# Patient Record
Sex: Male | Born: 1945 | Race: White | Hispanic: No | Smoking: Former smoker
Health system: Southern US, Community
[De-identification: ages and names within clinical notes are randomized; demographics above are authoritative.]

## PROBLEM LIST (undated history)

## (undated) DIAGNOSIS — C801 Malignant (primary) neoplasm, unspecified: Secondary | ICD-10-CM

## (undated) DIAGNOSIS — J449 Chronic obstructive pulmonary disease, unspecified: Secondary | ICD-10-CM

## (undated) HISTORY — PX: HERNIA REPAIR: SHX51

## (undated) HISTORY — PX: CHOLECYSTECTOMY: SHX55

---

## 2008-08-02 ENCOUNTER — Ambulatory Visit: Payer: Self-pay | Admitting: General Practice

## 2010-02-18 ENCOUNTER — Emergency Department: Payer: Self-pay | Admitting: Emergency Medicine

## 2017-11-06 ENCOUNTER — Emergency Department: Payer: Medicare Other

## 2017-11-06 ENCOUNTER — Inpatient Hospital Stay
Admission: EM | Admit: 2017-11-06 | Discharge: 2017-11-08 | DRG: 191 | Disposition: A | Discharge status: Home Health | Payer: Medicare Other | Attending: Internal Medicine | Admitting: Internal Medicine

## 2017-11-06 ENCOUNTER — Other Ambulatory Visit: Payer: Self-pay

## 2017-11-06 DIAGNOSIS — Z79899 Other long term (current) drug therapy: Secondary | ICD-10-CM | POA: Diagnosis not present

## 2017-11-06 DIAGNOSIS — C679 Malignant neoplasm of bladder, unspecified: Secondary | ICD-10-CM | POA: Diagnosis present

## 2017-11-06 DIAGNOSIS — R0603 Acute respiratory distress: Secondary | ICD-10-CM | POA: Diagnosis present

## 2017-11-06 DIAGNOSIS — J441 Chronic obstructive pulmonary disease with (acute) exacerbation: Secondary | ICD-10-CM | POA: Diagnosis present

## 2017-11-06 DIAGNOSIS — D649 Anemia, unspecified: Secondary | ICD-10-CM | POA: Diagnosis not present

## 2017-11-06 DIAGNOSIS — E876 Hypokalemia: Secondary | ICD-10-CM | POA: Diagnosis present

## 2017-11-06 DIAGNOSIS — Z87891 Personal history of nicotine dependence: Secondary | ICD-10-CM | POA: Diagnosis not present

## 2017-11-06 DIAGNOSIS — C775 Secondary and unspecified malignant neoplasm of intrapelvic lymph nodes: Secondary | ICD-10-CM | POA: Diagnosis present

## 2017-11-06 DIAGNOSIS — Z6821 Body mass index (BMI) 21.0-21.9, adult: Secondary | ICD-10-CM | POA: Diagnosis not present

## 2017-11-06 DIAGNOSIS — K922 Gastrointestinal hemorrhage, unspecified: Secondary | ICD-10-CM | POA: Diagnosis present

## 2017-11-06 DIAGNOSIS — D6959 Other secondary thrombocytopenia: Secondary | ICD-10-CM | POA: Diagnosis present

## 2017-11-06 DIAGNOSIS — E440 Moderate protein-calorie malnutrition: Secondary | ICD-10-CM | POA: Diagnosis present

## 2017-11-06 DIAGNOSIS — Z9981 Dependence on supplemental oxygen: Secondary | ICD-10-CM | POA: Diagnosis not present

## 2017-11-06 DIAGNOSIS — R0602 Shortness of breath: Secondary | ICD-10-CM | POA: Diagnosis not present

## 2017-11-06 DIAGNOSIS — D61818 Other pancytopenia: Secondary | ICD-10-CM | POA: Diagnosis present

## 2017-11-06 DIAGNOSIS — T451X5A Adverse effect of antineoplastic and immunosuppressive drugs, initial encounter: Secondary | ICD-10-CM | POA: Diagnosis not present

## 2017-11-06 DIAGNOSIS — D6481 Anemia due to antineoplastic chemotherapy: Secondary | ICD-10-CM | POA: Diagnosis not present

## 2017-11-06 HISTORY — DX: Malignant (primary) neoplasm, unspecified: C80.1

## 2017-11-06 HISTORY — DX: Chronic obstructive pulmonary disease, unspecified: J44.9

## 2017-11-06 LAB — CBC WITH DIFFERENTIAL/PLATELET
Basophils Absolute: 0 10*3/uL (ref 0–0.1)
Basophils Relative: 0 %
Eosinophils Absolute: 0.1 10*3/uL (ref 0–0.7)
Eosinophils Relative: 1 %
HCT: 22.9 % — ABNORMAL LOW (ref 40.0–52.0)
Hemoglobin: 7.7 g/dL — ABNORMAL LOW (ref 13.0–18.0)
LYMPHS ABS: 2.6 10*3/uL (ref 1.0–3.6)
LYMPHS PCT: 29 %
MCH: 30 pg (ref 26.0–34.0)
MCHC: 33.6 g/dL (ref 32.0–36.0)
MCV: 89.3 fL (ref 80.0–100.0)
MONOS PCT: 8 %
Monocytes Absolute: 0.7 10*3/uL (ref 0.2–1.0)
NEUTROS ABS: 5.5 10*3/uL (ref 1.4–6.5)
NEUTROS PCT: 62 %
Platelets: 78 10*3/uL — ABNORMAL LOW (ref 150–440)
RBC: 2.56 MIL/uL — ABNORMAL LOW (ref 4.40–5.90)
RDW: 19.7 % — ABNORMAL HIGH (ref 11.5–14.5)
WBC: 8.9 10*3/uL (ref 3.8–10.6)

## 2017-11-06 LAB — BASIC METABOLIC PANEL WITH GFR
Anion gap: 10 (ref 5–15)
BUN: 27 mg/dL — ABNORMAL HIGH (ref 8–23)
CO2: 22 mmol/L (ref 22–32)
Calcium: 8.3 mg/dL — ABNORMAL LOW (ref 8.9–10.3)
Chloride: 106 mmol/L (ref 98–111)
Creatinine, Ser: 1.22 mg/dL (ref 0.61–1.24)
GFR calc Af Amer: >60 mL/min
GFR calc non Af Amer: 58 mL/min — ABNORMAL LOW
Glucose, Bld: 123 mg/dL — ABNORMAL HIGH (ref 70–99)
Potassium: 3.6 mmol/L (ref 3.5–5.1)
Sodium: 138 mmol/L (ref 135–145)

## 2017-11-06 LAB — FIBRIN DERIVATIVES D-DIMER (ARMC ONLY): Fibrin derivatives D-dimer (ARMC): 1298.57 ng/mL (FEU) — ABNORMAL HIGH (ref 0.00–499.00)

## 2017-11-06 LAB — TROPONIN I: Troponin I: 0.03 ng/mL (ref <0.03)

## 2017-11-06 MED ORDER — TRAZODONE HCL 50 MG PO TABS: 25.0000 mg | ORAL_TABLET | Freq: Every evening | ORAL | Status: DC | PRN

## 2017-11-06 MED ORDER — SODIUM CHLORIDE 0.9 % IV SOLN
8.0000 mg/h | INTRAVENOUS | Status: DC
  Administered 2017-11-06 – 2017-11-07 (×2): 8 mg/h via INTRAVENOUS
  Filled 2017-11-06 (×2): qty 80

## 2017-11-06 MED ORDER — DOCUSATE SODIUM 100 MG PO CAPS
100.0000 mg | ORAL_CAPSULE | Freq: Two times a day (BID) | ORAL | Status: DC
  Administered 2017-11-07 – 2017-11-08 (×3): 100 mg via ORAL
  Filled 2017-11-06 (×3): qty 1

## 2017-11-06 MED ORDER — IOHEXOL 350 MG/ML SOLN
75.0000 mL | Freq: Once | INTRAVENOUS | Status: AC | PRN
  Administered 2017-11-06: 75 mL via INTRAVENOUS

## 2017-11-06 MED ORDER — HYDROCODONE-ACETAMINOPHEN 5-325 MG PO TABS: 1.0000 | ORAL_TABLET | ORAL | Status: DC | PRN

## 2017-11-06 MED ORDER — BISACODYL 5 MG PO TBEC: 5.0000 mg | DELAYED_RELEASE_TABLET | Freq: Every day | ORAL | Status: DC | PRN

## 2017-11-06 MED ORDER — ACETAMINOPHEN 650 MG RE SUPP: 650.0000 mg | Freq: Four times a day (QID) | RECTAL | Status: DC | PRN

## 2017-11-06 MED ORDER — IPRATROPIUM-ALBUTEROL 0.5-2.5 (3) MG/3ML IN SOLN
3.0000 mL | Freq: Once | RESPIRATORY_TRACT | Status: AC
  Administered 2017-11-06: 3 mL via RESPIRATORY_TRACT
  Filled 2017-11-06: qty 3

## 2017-11-06 MED ORDER — ONDANSETRON HCL 4 MG PO TABS: 4.0000 mg | ORAL_TABLET | Freq: Four times a day (QID) | ORAL | Status: DC | PRN

## 2017-11-06 MED ORDER — ATORVASTATIN CALCIUM 20 MG PO TABS
80.0000 mg | ORAL_TABLET | Freq: Every day | ORAL | Status: DC
  Administered 2017-11-07: 80 mg via ORAL
  Filled 2017-11-06: qty 4

## 2017-11-06 MED ORDER — SODIUM CHLORIDE 0.9 % IV SOLN
80.0000 mg | Freq: Once | INTRAVENOUS | Status: AC
  Administered 2017-11-06: 80 mg via INTRAVENOUS
  Filled 2017-11-06: qty 80

## 2017-11-06 MED ORDER — ACETAMINOPHEN 325 MG PO TABS: 650.0000 mg | ORAL_TABLET | Freq: Four times a day (QID) | ORAL | Status: DC | PRN

## 2017-11-06 MED ORDER — SODIUM CHLORIDE 0.9 % IV SOLN
Freq: Once | INTRAVENOUS | Status: AC
  Administered 2017-11-06: 23:00:00 via INTRAVENOUS

## 2017-11-06 MED ORDER — ONDANSETRON HCL 4 MG/2ML IJ SOLN: 4.0000 mg | Freq: Four times a day (QID) | INTRAMUSCULAR | Status: DC | PRN

## 2017-11-06 NOTE — ED Provider Notes (Addendum)
[facility] Emergency Department Provider Note   ____________________________________________   I have reviewed the triage vital signs and the nursing notes.   HISTORY  Chief Complaint Respiratory Distress   History limited by: Not Limited   HPI Lee Clarke is a 72 y.o. male who presents to the emergency department today via EMS because of concerns for breathing difficulty.  The patient states that it is been going on for the past 3 to 4 days.  He does have a history of COPD.  He has tried inhalers without any relief.  Patient is currently undergoing treatment for cancer.  He states that in addition to the shortness of breath he has had some right lower chest pain.  This is also been going on for a few days.  He denies any fevers.  He denies any cough.  Has had some nausea but no vomiting.  Denies any chest pain.   Per medical record review patient has a history of COPD, bladder cancer.  Past Medical History:  Diagnosis Date  . Cancer [facility])    bladder cancer  . COPD (chronic obstructive pulmonary disease) (HCC)     There are no active problems to display for this patient.   Past Surgical History:  Procedure Laterality Date  . CHOLECYSTECTOMY    . HERNIA REPAIR      Prior to Admission medications   Not on File    Allergies Ibuprofen  History reviewed. No pertinent family history.  Social History Social History   Tobacco Use  . Smoking status: Former Research scientist (life sciences)  . Smokeless tobacco: Never Used  Substance Use Topics  . Alcohol use: Never    Frequency: Never  . Drug use: Never    Review of Systems Constitutional: No fever/chills Eyes: No visual changes. ENT: No sore throat. Cardiovascular: Denies chest pain. Respiratory: Positive for shortness of breath. Gastrointestinal: No abdominal pain.  No nausea, no vomiting.  No diarrhea.   Genitourinary: Negative for dysuria. Musculoskeletal: Negative for back pain. Skin: Negative for  rash. Neurological: Negative for headaches, focal weakness or numbness.  ____________________________________________   PHYSICAL EXAM:  VITAL SIGNS: ED Triage Vitals  Enc Vitals Group     BP --      Pulse Rate 11/06/17 1645 (!) 120     Resp 11/06/17 1645 (!) 22     Temp 11/06/17 1645 98 F (36.7 C)     Temp Source 11/06/17 1645 Oral     SpO2 11/06/17 1645 99 %     Weight 11/06/17 1646 127 lb (57.6 kg)     Height 11/06/17 1646 5\' 4"  (1.626 m)     Head Circumference --      Peak Flow --      Pain Score 11/06/17 1646 6   Constitutional: Alert and oriented.  Eyes: Conjunctivae are normal.  ENT      Head: Normocephalic and atraumatic.      Nose: No congestion/rhinnorhea.      Mouth/Throat: Mucous membranes are moist.      Neck: No stridor. Hematological/Lymphatic/Immunilogical: No cervical lymphadenopathy. Cardiovascular: Normal rate, regular rhythm.  No murmurs, rubs, or gallops.  Respiratory: Slightly increased respiratory effort. Diffuse expiratory wheezing. Gastrointestinal: Soft and non tender. No rebound. No guarding.  Genitourinary: Deferred Musculoskeletal: Normal range of motion in all extremities. No lower extremity edema. Neurologic:  Normal speech and language. No gross focal neurologic deficits are appreciated.  Skin:  Skin is warm, dry and intact. No rash noted. Psychiatric: Mood and  affect are normal. Speech and behavior are normal. Patient exhibits appropriate insight and judgment.  ____________________________________________    LABS (pertinent positives/negatives)  D-dimer 1298 Trop 0.03 CBC wbc 8.9, hgb 7.7, plt 78 BMP na 138, k 3.6, glu 123, cr 1.22  ____________________________________________   EKG  I, Nance Pear, attending physician, personally viewed and interpreted this EKG  EKG Time: 1707 Rate: 108 Rhythm: sinus tachycardia Axis: normal Intervals: qtc 467 QRS: narrow ST changes: no st elevation Impression: abnormal  ekg   ____________________________________________    RADIOLOGY  CT angio Negative for PE  CXR No acute disease ____________________________________________   PROCEDURES  Procedures  Angiocath insertion Performed by: Nance Pear  Consent: Verbal consent obtained.  Preparation: Patient was prepped and draped in the usual sterile fashion.  Vein Location: left ac  Ultrasound Guided  Gauge: 20  Normal blood return and flush without difficulty Patient tolerance: Patient tolerated the procedure well with no immediate complications.    ____________________________________________   INITIAL IMPRESSION / ASSESSMENT AND PLAN / ED COURSE  Pertinent labs & imaging results that were available during my care of the patient were reviewed by me and considered in my medical decision making (see chart for details).   Presented to the emergency department today because of concerns for shortness of breath over the past 3 days.  She does have a history of COPD.  Would consider COPD, pneumonia, pneumothorax given history of cancer, anemia, ACS as well as other etiologies.  Patient was found to be anemic.  Guaiac was positive.  They think this could be the source of the patient's shortness of breath.  He was started on Protonix.  However given concern for right lower chest pain and cancer history CT Angie was performed to evaluate for PE.  This was negative.  Will plan on admission to the hospital service for G I bleed  ____________________________________________   FINAL CLINICAL IMPRESSION(S) / ED DIAGNOSES  Final diagnoses:  Shortness of breath  Gastrointestinal hemorrhage, unspecified gastrointestinal hemorrhage type  Anemia, unspecified type     Note: This dictation was prepared with Dragon dictation. Any transcriptional errors that result from this process are unintentional     Nance Pear, MD 11/06/17 2051    Nance Pear, MD 11/06/17 2105

## 2017-11-06 NOTE — H&P (Signed)
[place] [facility] [place] NAME: Lee Clarke    MR#:  [id number]  DATE OF BIRTH:  03/16/1946  DATE OF ADMISSION:  11/06/2017  PRIMARY CARE PHYSICIAN: System, Pcp Not In   REQUESTING/REFERRING PHYSICIAN:   CHIEF COMPLAINT:   Chief Complaint  Patient presents with  . Respiratory Distress    HISTORY OF PRESENT ILLNESS: Lee Clarke  is a 72 y.o. male with a known history of COPD on 3.5L chronic oxygen, and urinary bladder cancer, currently undergoing chemotherapy, most recent cycle was 3 weeks ago. Patient presented to emergency room for worsening shortness of breath going on for the past 3 days, gradually getting worse.  Oxygen saturation is 90% on 4 L oxygen.  Chronic productive cough is associated, but no fever or chills, no chest pain, no N/VD, no blood in stool/urine. Blood test done emergency room show low hemoglobin level at 7.7, troponin level is 0.03.  Fecal occult test is positive. EKG shows sinus tachycardia with heart rate at 108, no acute ischemic changes. CT angiogram of the chest is negative for PE and infiltrates. Patient is admitted for further evaluation and treatment.  PAST MEDICAL HISTORY:   Past Medical History:  Diagnosis Date  . Cancer [facility])    bladder cancer  . COPD (chronic obstructive pulmonary disease) ([place])     PAST SURGICAL HISTORY:  Past Surgical History:  Procedure Laterality Date  . CHOLECYSTECTOMY    . HERNIA REPAIR      SOCIAL HISTORY:  Social History   Tobacco Use  . Smoking status: Former Smoker    Last attempt to quit: 05/02/2000    Years since quitting: 17.5  . Smokeless tobacco: Never Used  Substance Use Topics  . Alcohol use: Not Currently    Frequency: Never    FAMILY HISTORY: History reviewed. No pertinent family history.  DRUG ALLERGIES:  Allergies  Allergen Reactions  . Ibuprofen     Legs swell    REVIEW OF SYSTEMS:   CONSTITUTIONAL: No fever, the patient complains of  fatigue and generalized weakness.  EYES: No blurred or double vision.  EARS, NOSE, AND THROAT: No tinnitus or ear pain.  RESPIRATORY: Positive for chronic productive cough, shortness of breath; no wheezing or hemoptysis.  CARDIOVASCULAR: No chest pain, orthopnea, edema.  GASTROINTESTINAL: No nausea, vomiting, diarrhea or abdominal pain.  GENITOURINARY: No dysuria, hematuria.  ENDOCRINE: No polyuria, nocturia,  HEMATOLOGY: Currently, patient denies bleeding, but he has had hematuria in the recent past, due to urinary bladder cancer. SKIN: No rash or lesion. MUSCULOSKELETAL: No joint pain or arthritis.   NEUROLOGIC: No tingling, numbness, weakness.  PSYCHIATRY: No anxiety or depression.   MEDICATIONS AT HOME:  Prior to Admission medications   Medication Sig Start Date End Date Taking? Authorizing Provider  amLODipine (NORVASC) 5 MG tablet Take 5 mg by mouth daily. 10/31/17  Yes [provider]  atorvastatin (LIPITOR) 80 MG tablet Take 80 mg by mouth daily. 10/25/17  Yes [provider]  dexamethasone (DECADRON) 4 MG tablet Take 4 mg by mouth daily. Take 2 tablets once a day on days 2,3,4,9,10,11   Yes [provider]  prochlorperazine (COMPAZINE) 10 MG tablet Take 10 mg by mouth every 6 (six) hours as needed for nausea or vomiting.   Yes [provider]  senna-docusate (SENOKOT-S) 8.6-50 MG tablet Take 2 tablets by mouth daily as needed for mild constipation.   Yes [provider]  tamsulosin (FLOMAX) 0.4 MG CAPS  capsule Take 0.4 mg by mouth daily.   Yes [provider]  Cholecalciferol (D 1000) 1000 units capsule Take 1 tablet by mouth daily.    [provider]  Multiple Vitamin (MULTI-VITAMINS) TABS Take 1 tablet by mouth daily.    [provider]  nitrofurantoin, macrocrystal-monohydrate, (MACROBID) 100 MG capsule Take 1 capsule by mouth 2 (two) times daily. 10 day 10/24/17   [provider]      PHYSICAL  EXAMINATION:   VITAL SIGNS: Blood pressure 128/61, pulse (!) 107, temperature (!) 97.5 F (36.4 C), temperature source Oral, resp. rate (!) 24, height 5\' 4"  (1.626 m), weight 56.7 kg (125 lb 1.6 oz), SpO2 100 %.  GENERAL:  72 y.o.-year-old patient lying in the bed with no acute distress, at rest.  EYES: Pupils equal, round, reactive to light and accommodation. No scleral icterus. Extraocular muscles intact.  HEENT: Head atraumatic, normocephalic. Oropharynx and nasopharynx clear.  NECK:  Supple, no jugular venous distention. No thyroid enlargement, no tenderness.  LUNGS: Patient is on 4 L oxygen per nasal cannula.  Reduced breath sounds bilaterally, no wheezing. No use of accessory muscles of respiration.  CARDIOVASCULAR: S1, S2 normal. No S3/S4.  ABDOMEN: Soft, nontender, nondistended. Bowel sounds present. EXTREMITIES: No pedal edema, cyanosis, or clubbing.  NEUROLOGIC: Cranial nerves II through XII are intact. Muscle strength 5/5 in all extremities. Sensation intact. PSYCHIATRIC: The patient is alert and oriented x 3.  SKIN: No obvious rash, lesion, or ulcer.   LABORATORY PANEL:   CBC Recent Labs  Lab 11/06/17 1646  WBC 8.9  HGB 7.7*  HCT 22.9*  PLT 78*  MCV 89.3  MCH 30.0  MCHC 33.6  RDW 19.7*  LYMPHSABS 2.6  MONOABS 0.7  EOSABS 0.1  BASOSABS 0.0   ------------------------------------------------------------------------------------------------------------------  Chemistries  Recent Labs  Lab 11/06/17 1646  NA 138  K 3.6  CL 106  CO2 22  GLUCOSE 123*  BUN 27*  CREATININE 1.22  CALCIUM 8.3*   ------------------------------------------------------------------------------------------------------------------ estimated creatinine clearance is 44.5 mL/min (by C-G formula based on SCr of 1.22 mg/dL). ------------------------------------------------------------------------------------------------------------------ No results for input(s): TSH, T4TOTAL, T3FREE,  THYROIDAB in the last 72 hours.  Invalid input(s): FREET3   Coagulation profile No results for input(s): INR, PROTIME in the last 168 hours. ------------------------------------------------------------------------------------------------------------------- No results for input(s): DDIMER in the last 72 hours. -------------------------------------------------------------------------------------------------------------------  Cardiac Enzymes Recent Labs  Lab 11/06/17 1646  TROPONINI 0.03*   ------------------------------------------------------------------------------------------------------------------ Invalid input(s): POCBNP  ---------------------------------------------------------------------------------------------------------------  Urinalysis No results found for: COLORURINE, APPEARANCEUR, LABSPEC, PHURINE, GLUCOSEU, HGBUR, BILIRUBINUR, KETONESUR, PROTEINUR, UROBILINOGEN, NITRITE, LEUKOCYTESUR   RADIOLOGY: Dg Chest 2 View  Result Date: 11/06/2017 CLINICAL DATA:  Shortness of breath for 3 days. EXAM: CHEST - 2 VIEW COMPARISON:  Chest x-ray dated 02/18/2010. FINDINGS: Cardiomediastinal silhouette appears grossly stable in size and configuration. Lungs appear clear. No pleural effusion or pneumothorax. Chronic severe scoliosis of the thoracic spine. No acute or suspicious osseous finding. IMPRESSION: No active cardiopulmonary disease. No evidence of pneumonia or pulmonary edema. Electronically Signed   By: Franki Cabot M.D.   On: 11/06/2017 17:17   Ct Angio Chest Pe W And/or Wo Contrast  Result Date: 11/06/2017 CLINICAL DATA:  Right lower chest pain and shortness of breath. History of bladder cancer. EXAM: CT ANGIOGRAPHY CHEST WITH CONTRAST TECHNIQUE: Multidetector CT imaging of the chest was performed using the standard protocol during bolus administration of intravenous contrast. Multiplanar CT image reconstructions and MIPs were obtained to evaluate the vascular anatomy.  CONTRAST:  22mL  OMNIPAQUE IOHEXOL 350 MG/ML SOLN COMPARISON:  None. FINDINGS: Cardiovascular: Satisfactory opacification of the pulmonary arteries to the segmental level. No evidence of pulmonary embolism. Normal heart size. No pericardial effusion. Aortic and coronary atherosclerotic plaque Mediastinum/Nodes: Negative for adenopathy. Lungs/Pleura: Prominent emphysema with hyperinflation. There is no edema, consolidation, effusion, or pneumothorax. Patient had a staging scan 10/24/2017 at [facility] per epic, images not available. Upper Abdomen: Colonic diverticulosis Musculoskeletal: Marked thoracic levoscoliosis with leftward meningeal bulging through expanded foramina. No acute osseous finding. Review of the MIP images confirms the above findings. IMPRESSION: 1. Negative for pulmonary embolism or other acute finding. 2. Aortic Atherosclerosis (ICD10-I70.0) and Emphysema (ICD10-J43.9). Electronically Signed   By: Monte Fantasia M.D.   On: 11/06/2017 20:25    EKG: Orders placed or performed during the hospital encounter of 11/06/17  . ED EKG  . ED EKG    IMPRESSION AND PLAN:   1.  Acute respiratory distress, likely multifactorial related to advanced COPD and severe anemia.  We will continue oxygen therapy as needed and COPD treatment.  Will transfuse 1 unit PRBC.  We will continue to monitor patient on telemetry and follow troponin levels, to rule out ACS. 2.  Symptomatic, severe anemia, likely secondary to bladder cancer and chemotherapy.  Upper GI bleed is possible as well, fecal occult test was positive in the emergency room.  Hemoglobin level is currently at 7.7.  Patient would likely benefit from 1 unit PRBC transfusion.  Continue to monitor hemoglobin level closely.  Gastroenterology is consulted for further evaluation and treatment. 3.  Upper GI bleed.  Fecal occult test is positive, although patient denies any blood in stool or black stools.  No abdominal pain, no N/V.  Will hold any anticoagulants  and start PPI treatment.  Gastroenterology is consulted for further evaluation and treatment. 4.  COPD, without acute exacerbation.  Continue home maintenance therapy. 5.  Urinary bladder cancer, ongoing chemotherapy.  Continue management per oncology.    All the records are reviewed and case discussed with ED provider. Management plans discussed with the patient and he is in agreement.  CODE STATUS: Full    Code Status Orders  (From admission, onward)        Start     Ordered   11/06/17 2201  Full code  Continuous     11/06/17 2200    Code Status History    This patient has a current code status but no historical code status.       TOTAL TIME TAKING CARE OF THIS PATIENT: 45 minutes.    Amelia Jo M.D on 11/06/2017 at 11:05 PM  Between 7am to 6pm - Pager - [phone]  After 6pm go to [email or web address] - password [email or web address] [place] [facility]  Office  [phone]  CC: Primary care physician; System, Pcp Not In

## 2017-11-06 NOTE — ED Triage Notes (Signed)
Pt came from home via EMS with complaints of difficulty breathing on exertion for past three days. Pt has not been eating or drinking very well. Pt has Hx of Bladder cancer and COPD. EMS reports hearing fluid sounds in bottom left lung. Pt is on 4L oxygen at home. Pt has NKA. VS per EMS BS-130 BP-116/60 HR-80s O2sat-90s RA. Pt alert and oriented x 4. Pt has complaints of pain on his right side.

## 2017-11-07 DIAGNOSIS — D6481 Anemia due to antineoplastic chemotherapy: Secondary | ICD-10-CM

## 2017-11-07 DIAGNOSIS — T451X5A Adverse effect of antineoplastic and immunosuppressive drugs, initial encounter: Secondary | ICD-10-CM

## 2017-11-07 DIAGNOSIS — E440 Moderate protein-calorie malnutrition: Secondary | ICD-10-CM

## 2017-11-07 DIAGNOSIS — D649 Anemia, unspecified: Secondary | ICD-10-CM

## 2017-11-07 LAB — BASIC METABOLIC PANEL
ANION GAP: 8 (ref 5–15)
BUN: 22 mg/dL (ref 8–23)
CALCIUM: 7.8 mg/dL — AB (ref 8.9–10.3)
CO2: 23 mmol/L (ref 22–32)
Chloride: 108 mmol/L (ref 98–111)
Creatinine, Ser: 1.13 mg/dL (ref 0.61–1.24)
GFR calc Af Amer: >60 mL/min (ref >60)
Glucose, Bld: 86 mg/dL (ref 70–99)
Potassium: 3.1 mmol/L — ABNORMAL LOW (ref 3.5–5.1)
Sodium: 139 mmol/L (ref 135–145)

## 2017-11-07 LAB — CBC
HCT: 26.4 % — ABNORMAL LOW (ref 40.0–52.0)
HEMOGLOBIN: 9.2 g/dL — AB (ref 13.0–18.0)
MCH: 29.8 pg (ref 26.0–34.0)
MCHC: 34.7 g/dL (ref 32.0–36.0)
MCV: 85.7 fL (ref 80.0–100.0)
Platelets: 53 10*3/uL — ABNORMAL LOW (ref 150–440)
RBC: 3.08 MIL/uL — AB (ref 4.40–5.90)
RDW: 17.3 % — ABNORMAL HIGH (ref 11.5–14.5)
WBC: 7.3 10*3/uL (ref 3.8–10.6)

## 2017-11-07 LAB — ABO/RH: ABO/RH(D): O POS

## 2017-11-07 LAB — TROPONIN I: Troponin I: 0.04 ng/mL (ref <0.03)

## 2017-11-07 LAB — GLUCOSE, CAPILLARY: GLUCOSE-CAPILLARY: 78 mg/dL (ref 70–99)

## 2017-11-07 LAB — PREPARE RBC (CROSSMATCH)

## 2017-11-07 MED ORDER — SENNOSIDES-DOCUSATE SODIUM 8.6-50 MG PO TABS: 2.0000 | ORAL_TABLET | Freq: Every day | ORAL | Status: DC | PRN

## 2017-11-07 MED ORDER — POTASSIUM CHLORIDE CRYS ER 20 MEQ PO TBCR
40.0000 meq | EXTENDED_RELEASE_TABLET | ORAL | Status: AC
  Administered 2017-11-07 (×2): 40 meq via ORAL
  Filled 2017-11-07 (×2): qty 2

## 2017-11-07 MED ORDER — SODIUM CHLORIDE 0.9% IV SOLUTION
Freq: Once | INTRAVENOUS | Status: AC
  Administered 2017-11-07: 03:00:00 via INTRAVENOUS

## 2017-11-07 MED ORDER — AMLODIPINE BESYLATE 5 MG PO TABS
5.0000 mg | ORAL_TABLET | Freq: Every day | ORAL | Status: DC
  Administered 2017-11-07 – 2017-11-08 (×2): 5 mg via ORAL
  Filled 2017-11-07 (×2): qty 1

## 2017-11-07 MED ORDER — POTASSIUM CHLORIDE IN NACL 40-0.9 MEQ/L-% IV SOLN
INTRAVENOUS | Status: DC
  Filled 2017-11-07: qty 1000

## 2017-11-07 MED ORDER — VITAMIN D 1000 UNITS PO TABS
1000.0000 [IU] | ORAL_TABLET | Freq: Every day | ORAL | Status: DC
  Administered 2017-11-07 – 2017-11-08 (×2): 1000 [IU] via ORAL
  Filled 2017-11-07 (×2): qty 1

## 2017-11-07 MED ORDER — ADULT MULTIVITAMIN W/MINERALS CH
1.0000 | ORAL_TABLET | Freq: Every day | ORAL | Status: DC
  Administered 2017-11-07 – 2017-11-08 (×2): 1 via ORAL
  Filled 2017-11-07: qty 1

## 2017-11-07 MED ORDER — PROCHLORPERAZINE MALEATE 10 MG PO TABS
10.0000 mg | ORAL_TABLET | Freq: Four times a day (QID) | ORAL | Status: DC | PRN
Start: 2017-11-07 — End: 2017-11-08
  Filled 2017-11-07: qty 1

## 2017-11-07 MED ORDER — ADULT MULTIVITAMIN W/MINERALS CH
1.0000 | ORAL_TABLET | Freq: Every day | ORAL | Status: DC
  Filled 2017-11-07: qty 1

## 2017-11-07 MED ORDER — TAMSULOSIN HCL 0.4 MG PO CAPS
0.4000 mg | ORAL_CAPSULE | Freq: Every day | ORAL | Status: DC
  Administered 2017-11-07 – 2017-11-08 (×2): 0.4 mg via ORAL
  Filled 2017-11-07 (×2): qty 1

## 2017-11-07 NOTE — Plan of Care (Signed)
  Problem: Education: Goal: Knowledge of General Education information will improve Outcome: Progressing   Problem: Health Behavior/Discharge Planning: Goal: Ability to manage health-related needs will improve Outcome: Progressing Note:  Gi to see pt today  pt npo for now and explained to pt why npo   Problem: Clinical Measurements: Goal: Ability to maintain clinical measurements within normal limits will improve Outcome: Progressing Goal: Will remain free from infection Outcome: Progressing Goal: Diagnostic test results will improve Outcome: Progressing Goal: Respiratory complications will improve Outcome: Progressing Goal: Cardiovascular complication will be avoided Outcome: Progressing   Problem: Activity: Goal: Risk for activity intolerance will decrease Outcome: Progressing   Problem: Nutrition: Goal: Adequate nutrition will be maintained Outcome: Progressing   Problem: Elimination: Goal: Will not experience complications related to bowel motility Outcome: Progressing Goal: Will not experience complications related to urinary retention Outcome: Progressing   Problem: Pain Managment: Goal: General experience of comfort will improve Outcome: Progressing   Problem: Safety: Goal: Ability to remain free from injury will improve Outcome: Progressing   Problem: Skin Integrity: Goal: Risk for impaired skin integrity will decrease Outcome: Progressing

## 2017-11-07 NOTE — Plan of Care (Signed)
  Problem: Education: Goal: Knowledge of General Education information will improve Outcome: Progressing   Problem: Health Behavior/Discharge Planning: Goal: Ability to manage health-related needs will improve Outcome: Progressing   Problem: Clinical Measurements: Goal: Ability to maintain clinical measurements within normal limits will improve Outcome: Progressing Goal: Will remain free from infection Outcome: Progressing Goal: Diagnostic test results will improve Outcome: Progressing Goal: Respiratory complications will improve Outcome: Progressing Goal: Cardiovascular complication will be avoided Outcome: Progressing   Problem: Activity: Goal: Risk for activity intolerance will decrease Outcome: Progressing   Problem: Nutrition: Goal: Adequate nutrition will be maintained Outcome: Progressing   Problem: Elimination: Goal: Will not experience complications related to bowel motility Outcome: Progressing Goal: Will not experience complications related to urinary retention Outcome: Progressing   Problem: Pain Managment: Goal: General experience of comfort will improve Outcome: Progressing   Problem: Safety: Goal: Ability to remain free from injury will improve Outcome: Progressing   Problem: Skin Integrity: Goal: Risk for impaired skin integrity will decrease Outcome: Progressing

## 2017-11-07 NOTE — Progress Notes (Signed)
[facility] NAME: Lee Clarke    MR#:  [id number]  DATE OF BIRTH:  July 10, 1945  SUBJECTIVE:  CHIEF COMPLAINT:   Chief Complaint  Patient presents with  . Respiratory Distress    REVIEW OF SYSTEMS:    ROS  Nutrition:  Tolerating Diet: Tolerating PT:      DRUG ALLERGIES:   Allergies  Allergen Reactions  . Ibuprofen     Legs swell    VITALS:  Blood pressure (!) 119/45, pulse 68, temperature 98 F (36.7 C), temperature source Oral, resp. rate 20, height 5\' 4"  (1.626 m), weight 57.7 kg (127 lb 1.6 oz), SpO2 100 %.  PHYSICAL EXAMINATION:   Physical Exam  GENERAL:  72 y.o.-year-old patient lying in the bed with no acute distress.  EYES: Pupils equal, round, reactive to light and accommodation. No scleral icterus. Extraocular muscles intact.  HEENT: Head atraumatic, normocephalic. Oropharynx and nasopharynx clear.  NECK:  Supple, no jugular venous distention. No thyroid enlargement, no tenderness.  LUNGS: Normal breath sounds bilaterally, no wheezing, rales,rhonchi or crepitation. No use of accessory muscles of respiration.  CARDIOVASCULAR: S1, S2 normal. No murmurs, rubs, or gallops.  ABDOMEN: Soft, nontender, nondistended. Bowel sounds present. No organomegaly or mass.  EXTREMITIES: No pedal edema, cyanosis, or clubbing.  NEUROLOGIC: Cranial nerves II through XII are intact. Muscle strength 5/5 in all extremities. Sensation intact. Gait not checked.  PSYCHIATRIC: The patient is alert and oriented x 3.  SKIN: No obvious rash, lesion, or ulcer.    LABORATORY PANEL:   CBC Recent Labs  Lab 11/07/17 0734  WBC 7.3  HGB 9.2*  HCT 26.4*  PLT 53*   ------------------------------------------------------------------------------------------------------------------  Chemistries  Recent Labs  Lab 11/07/17 0734  NA 139  K 3.1*  CL 108  CO2 23  GLUCOSE 86  BUN 22  CREATININE 1.13  CALCIUM 7.8*    ------------------------------------------------------------------------------------------------------------------  Cardiac Enzymes Recent Labs  Lab 11/07/17 0734  TROPONINI 0.04*   ------------------------------------------------------------------------------------------------------------------  RADIOLOGY:  Dg Chest 2 View  Result Date: 11/06/2017 CLINICAL DATA:  Shortness of breath for 3 days. EXAM: CHEST - 2 VIEW COMPARISON:  Chest x-ray dated 02/18/2010. FINDINGS: Cardiomediastinal silhouette appears grossly stable in size and configuration. Lungs appear clear. No pleural effusion or pneumothorax. Chronic severe scoliosis of the thoracic spine. No acute or suspicious osseous finding. IMPRESSION: No active cardiopulmonary disease. No evidence of pneumonia or pulmonary edema. Electronically Signed   By: Franki Cabot M.D.   On: 11/06/2017 17:17   Ct Angio Chest Pe W And/or Wo Contrast  Result Date: 11/06/2017 CLINICAL DATA:  Right lower chest pain and shortness of breath. History of bladder cancer. EXAM: CT ANGIOGRAPHY CHEST WITH CONTRAST TECHNIQUE: Multidetector CT imaging of the chest was performed using the standard protocol during bolus administration of intravenous contrast. Multiplanar CT image reconstructions and MIPs were obtained to evaluate the vascular anatomy. CONTRAST:  82mL OMNIPAQUE IOHEXOL 350 MG/ML SOLN COMPARISON:  None. FINDINGS: Cardiovascular: Satisfactory opacification of the pulmonary arteries to the segmental level. No evidence of pulmonary embolism. Normal heart size. No pericardial effusion. Aortic and coronary atherosclerotic plaque Mediastinum/Nodes: Negative for adenopathy. Lungs/Pleura: Prominent emphysema with hyperinflation. There is no edema, consolidation, effusion, or pneumothorax. Patient had a staging scan 10/24/2017 at [facility] per epic, images not available. Upper Abdomen: Colonic diverticulosis Musculoskeletal: Marked thoracic levoscoliosis with leftward  meningeal bulging through expanded foramina. No acute osseous finding. Review of the MIP images confirms the above findings. IMPRESSION:  1. Negative for pulmonary embolism or other acute finding. 2. Aortic Atherosclerosis (ICD10-I70.0) and Emphysema (ICD10-J43.9). Electronically Signed   By: Monte Fantasia M.D.   On: 11/06/2017 20:25     ASSESSMENT AND PLAN:   Active Problems:   Acute respiratory distress   Malnutrition of moderate degree  1.  #1 acute respiratory distress secondary to COPD exacerbation, patient feeling better, no wheezing, patient was to 1/2 to 3 L of oxygen, continue bronchodilators, steroids, empiric antibiotics, likely discharge home tomorrow. Tonic respiratory failure, patient is on home oxygen 3 and half liters.  2.  Symptomatic anemia with shortness of breath status post 1 unit of transfusion, hemoglobin improved from 7.7-9.2. 3.  Guaiac positive stool: Patient seen by gastroenterology, patient can have luminal evaluation with colonoscopy as an outpatient. 4.  Anemia, thrombocytopenia secondary to chemotherapy that he is getting at [facility] for his prostate cancer, patient follows up with [facility] for his chemotherapy and he is due for chemotherapy today but he is admitted here because of COPD flare.   will likely be discharged home tomorrow and patient can follow-up with [facility] oncology.  Pancytopenia secondary to ongoing chemotherapy, patient can follow-up with you [facility] oncology who is his primary oncologist.  At this time patient does not need inpatient hematology oncology consult because patient is already established at [facility] and he will likely be discharged tomorrow and then he can see his primary oncologist at [facility] oncologist on Wednesday. #5 hypokalemia: Replace the potassium All the records are reviewed and case discussed with Care Management/Social Workerr. Management plans discussed with the patient, family and they are in agreement.  CODE STATUS:full  TOTAL TIME  TAKING CARE OF THIS PATIENT: 40 minutes.   POSSIBLE D/C IN 1-2 DAYS, DEPENDING ON CLINICAL CONDITION. Than 50% time spent in counseling, coordination, reviewing charts, discussing with patient  Epifanio Lesches M.D on 11/07/2017 at 2:06 PM  Between 7am to 6pm - Pager - [phone]  After 6pm go to [email or web address] - password [email or web address] [facility]  Office  [phone]  CC: Primary care physician; System, Pcp Not In

## 2017-11-07 NOTE — Care Management Note (Signed)
Case Management Note  Patient Details  Name: Lee Clarke MRN: [id number] Date of Birth: 09/06/1945  Subjective/Objective:  RNCM received consult from MD. Patient currently lives at home with his roommate but his daughter Mikle Bosworth [phone] checks on him frequently. At baseline he is independent with activities of daily living and will occasionally use a walker. Patient still drives. Receiving chemo at [facility] cancer center. PCP is Dr Hollice Espy at [place]. Patient is on chronic oxygen via [facility] practice in [place], [place]. No PT consult. Patient reports mild weakness but thinks his PCP is planning on setting him up with outpatient therapy services. If home health is ordered  , patient would be agreeable.                   Action/Plan: RNCM to continue to follow for any needs.    Expected Discharge Date:                  Expected Discharge Plan:     In-House Referral:     Discharge planning Services     Post Acute Care Choice:    Choice offered to:     DME Arranged:    DME Agency:     HH Arranged:    HH Agency:     Status of Service:     If discussed at [employer], dates discussed:    Additional Comments:  Latanya Maudlin, RN 11/07/2017, 2:51 PM

## 2017-11-07 NOTE — Progress Notes (Signed)
Initial Nutrition Assessment  DOCUMENTATION CODES:   Non-severe (moderate) malnutrition in context of chronic illness  INTERVENTION:   - MVI with minerals daily  - Once diet advanced, Ensure Enlive po TID, each supplement provides 350 kcal and 20 grams of protein (pt prefers chocolate flavor)  NUTRITION DIAGNOSIS:   Moderate Malnutrition related to chronic illness, cancer and cancer related treatments(COPD, bladder cancer currently undergoing chemotherapy) as evidenced by moderate fat depletion, severe fat depletion, moderate muscle depletion, severe muscle depletion.  GOAL:   Patient will meet greater than or equal to 90% of their needs  MONITOR:   Diet advancement, Weight trends, Skin, I & O's, Labs, Supplement acceptance  REASON FOR ASSESSMENT:   Malnutrition Screening Tool    ASSESSMENT:   72 year old male who presented to the ED with respiratory distress. PMH significant for COPD on 3.5 L chronic oxygen and bladder cancer currently undergoing chemotherapy with most recent cycle 3 weeks ago.  Discussed pt with RN. Pt is NPO awaiting GI consultation.  Spoke with pt at bedside who was in good spirits but reported being very hungry. Discussed and explained purpose of NPO status. Pt states that the last time he had sometime to eat was yesterday evening and that he consumed half of a ham sandwich and a banana.  Pt reports that he has been experiencing nausea in the morning "for years." Pt states that this does not happen everyday but has increased in frequency since he started chemotherapy 2 months ago. Pt endorses "dry heaving" but denies vomiting. Pt states that it is "not really serious." Pt states that he has had a poor appetite recently and that he can't taste food. Pt reports that his oncology doctor is going to prescribe him some medication to help with this issue.  Pt reports that he eats at lest 3 meals daily and takes a multivitamin daily.  Breakfast: rice krispy  cereal, Ensure oral nutrition supplement, "large glass" of chocolate milk Lunch: sandwich or "big bowl" of canned chicken and rice soup Dinner: half of a fried chicken leg from [place], half of a biscuit with gravy  Pt states that he has lost 25-30 lbs since starting chemotherapy 2 months ago. Pt reports his UBW as 149-151 lbs. No weight history available in pt's chart.  Medications reviewed and include: 100 mg Colace BID, IV Protonix  Labs reviewed: potassium 3.1 (L), calcium 7.8 (L), hemoglobin 9.2 (L), HCT 26.4 (L)  NUTRITION - FOCUSED PHYSICAL EXAM:    Most Recent Value  Orbital Region  Severe depletion  Upper Arm Region  Moderate depletion  Thoracic and Lumbar Region  Moderate depletion  Buccal Region  Moderate depletion  Temple Region  Severe depletion  Clavicle Bone Region  Moderate depletion  Clavicle and Acromion Bone Region  Severe depletion  Scapular Bone Region  Moderate depletion  Dorsal Hand  Moderate depletion  Patellar Region  Severe depletion  Anterior Thigh Region  Severe depletion  Posterior Calf Region  Severe depletion  Edema (RD Assessment)  None  Hair  Reviewed  Eyes  Reviewed  Mouth  Reviewed  Skin  Reviewed  Nails  Reviewed       Diet Order:   Diet Order           Diet NPO time specified  Diet effective midnight          EDUCATION NEEDS:   No education needs have been identified at this time  Skin:  Skin Assessment: Reviewed RN Assessment(abraision to  BUE)  Last BM:  11/03/17  Height:   Ht Readings from Last 1 Encounters:  11/06/17 5\' 4"  (1.626 m)    Weight:   Wt Readings from Last 1 Encounters:  11/07/17 127 lb 1.6 oz (57.7 kg)    Ideal Body Weight:  54.55 kg  BMI:  Body mass index is 21.82 kg/m.  Estimated Nutritional Needs:   Kcal:  1730-1962 kcal/day (30-34 kcal/kg)  Protein:  75-87 grams/day (1.3-1.5 grams/kg)  Fluid:  1.8-2.0 L/day    Gaynell Face, MS, RD, LDN Pager:  [phone] Weekend/After Hours: [phone]

## 2017-11-07 NOTE — Progress Notes (Signed)
Family Meeting Note  Advance Directive:yes  Today a meeting took place with the Patient.  Pt bladder cancer, advanced COPD on oxygen at home.  Discussed the CODE STATUS with patient, he said he is okay with CPR, intubation for short duration.  The following clinical team members were present during this meeting:MD  The following were discussed:Patient's diagnosis: , Patient's progosis: Unable to determine and Goals for treatment: Continue present management  Additional follow-up to be provided: will follow  Time spent during discussion:20 minutes  Epifanio Lesches, MD

## 2017-11-07 NOTE — Consult Note (Signed)
Vonda Antigua, MD [address] [place], [place], [place], [address] [address], [place], [place], [place], [address] Phone: [phone]  Fax: [phone]  Consultation  Referring Provider:     Dr. Vianne Bulls Primary Care Physician:  System, Pcp Not In Reason for Consultation:     Anemia, GI bleed  Date of Admission:  11/06/2017 Date of Consultation:  11/07/2017         HPI:   Lee Clarke is a 72 y.o. male with history of COPD, presents with shortness of breath at home for the last 4 days.  Increased on exertion.  Denies any melena, hematochezia, hematemesis.  No abdominal pain or nausea or vomiting.  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  He is not on any antiplatelets, or anticoagulants at home.  No NSAID use.  Labs on admission showed hemoglobin of 7.7 yesterday, low platelets at 78 yesterday.  He received 1 packed red blood cells on admission and hemoglobin today is 9.2, without any signs of active GI bleeding.  Labs in care everywhere reviewed, and lab work done 10 to 11 days ago, shows hemoglobin of 12, platelets 410, white cell count of 21.9.  No previous EGDs.  History of a colonoscopy as documented in 2013 at [facility], but report is not available.  Past Medical History:  Diagnosis Date  . Cancer [facility])    bladder cancer  . COPD (chronic obstructive pulmonary disease) ([place])     Past Surgical History:  Procedure Laterality Date  . CHOLECYSTECTOMY    . HERNIA REPAIR      Prior to Admission medications   Medication Sig Start Date End Date Taking? Authorizing Provider  amLODipine (NORVASC) 5 MG tablet Take 5 mg by mouth daily. 10/31/17  Yes [provider]  atorvastatin (LIPITOR) 80 MG tablet Take 80 mg by mouth daily. 10/25/17  Yes [provider]  dexamethasone (DECADRON) 4 MG tablet Take 4 mg by mouth daily. Take 2 tablets once a day on days 2,3,4,9,10,11   Yes [provider]  prochlorperazine (COMPAZINE) 10 MG tablet Take 10 mg by mouth every 6 (six) hours as needed for nausea or vomiting.   Yes [provider]  senna-docusate (SENOKOT-S) 8.6-50 MG tablet Take 2 tablets by mouth daily as needed for mild constipation.   Yes [provider]  tamsulosin (FLOMAX) 0.4 MG CAPS capsule Take 0.4 mg by mouth daily.   Yes [provider]  Cholecalciferol (D 1000) 1000 units capsule Take 1 tablet by mouth daily.    [provider]  Multiple Vitamin (MULTI-VITAMINS) TABS Take 1 tablet by mouth daily.    [provider]  nitrofurantoin, macrocrystal-monohydrate, (MACROBID) 100 MG capsule Take 1 capsule by mouth 2 (two) times daily. 10 day 10/24/17   [provider]    History reviewed. No pertinent family history.   Social History   Tobacco Use  . Smoking status: Former Smoker    Last attempt to quit: 05/02/2000    Years since quitting: 17.5  . Smokeless tobacco: Never Used  Substance Use Topics  . Alcohol use: Not Currently    Frequency: Never  . Drug use: Not Currently    Allergies as of 11/06/2017 - Review Complete 11/06/2017  Allergen Reaction Noted  . Ibuprofen  11/06/2017    Review of Systems:    All systems reviewed and negative except where noted in HPI.   Physical Exam:  Vital signs in last 24 hours: Vitals:   11/07/17 0310 11/07/17 0338 11/07/17 0602 11/07/17 0645  BP: (!) 116/51 (!) 99/50  (!) 126/43  Pulse: 81 88  70  Resp: (!) 24 (!) 24  (!) 24  Temp: 97.9 F (36.6 C) 97.9 F (36.6 C)  97.9 F (36.6 C)  TempSrc: Oral Oral  Oral  SpO2:  100%    Weight: 125 lb 9.6 oz (57 kg)  127 lb 1.6 oz (57.7 kg)   Height:       Last BM Date: 11/03/17 General:   Pleasant, cooperative in NAD Head:  Normocephalic and atraumatic. Eyes:   No icterus.   Conjunctiva pink. PERRLA. Ears:  Normal auditory acuity. Neck:  Supple; no masses or thyroidomegaly Lungs: Respirations even and  unlabored. Lungs clear to auscultation bilaterally.   No wheezes, crackles, or rhonchi.  Abdomen:  Soft, nondistended, nontender. Normal bowel sounds. No appreciable masses or hepatomegaly.  No rebound or guarding.  Neurologic:  Alert and oriented x3;  grossly normal neurologically. Skin:  Intact without significant lesions or rashes. Cervical Nodes:  No significant cervical adenopathy. Psych:  Alert and cooperative. Normal affect.  LAB RESULTS: Recent Labs    11/06/17 1646 11/07/17 0734  WBC 8.9 7.3  HGB 7.7* 9.2*  HCT 22.9* 26.4*  PLT 78* 53*   BMET Recent Labs    11/06/17 1646 11/07/17 0734  NA 138 139  K 3.6 3.1*  CL 106 108  CO2 22 23  GLUCOSE 123* 86  BUN 27* 22  CREATININE 1.22 1.13  CALCIUM 8.3* 7.8*   LFT No results for input(s): PROT, ALBUMIN, AST, ALT, ALKPHOS, BILITOT, BILIDIR, IBILI in the last 72 hours. PT/INR No results for input(s): LABPROT, INR in the last 72 hours.  STUDIES: Dg Chest 2 View  Result Date: 11/06/2017 CLINICAL DATA:  Shortness of breath for 3 days. EXAM: CHEST - 2 VIEW COMPARISON:  Chest x-ray dated 02/18/2010. FINDINGS: Cardiomediastinal silhouette appears grossly stable in size and configuration. Lungs appear clear. No pleural effusion or pneumothorax. Chronic severe scoliosis of the thoracic spine. No acute or suspicious osseous finding. IMPRESSION: No active cardiopulmonary disease. No evidence of pneumonia or pulmonary edema. Electronically Signed   By: Franki Cabot M.D.   On: 11/06/2017 17:17   Ct Angio Chest Pe W And/or Wo Contrast  Result Date: 11/06/2017 CLINICAL DATA:  Right lower chest pain and shortness of breath. History of bladder cancer. EXAM: CT ANGIOGRAPHY CHEST WITH CONTRAST TECHNIQUE: Multidetector CT imaging of the chest was performed using the standard protocol during bolus administration of intravenous contrast. Multiplanar CT image reconstructions and MIPs were obtained to evaluate the vascular anatomy. CONTRAST:  41mL  OMNIPAQUE IOHEXOL 350 MG/ML SOLN COMPARISON:  None. FINDINGS: Cardiovascular: Satisfactory opacification of the pulmonary arteries to the segmental level. No evidence of pulmonary embolism. Normal heart size. No pericardial effusion. Aortic and coronary atherosclerotic plaque Mediastinum/Nodes: Negative for adenopathy. Lungs/Pleura: Prominent emphysema with hyperinflation. There is no edema, consolidation, effusion, or pneumothorax. Patient had a staging scan 10/24/2017 at [facility] per epic, images not available. Upper Abdomen: Colonic diverticulosis Musculoskeletal: Marked thoracic levoscoliosis with leftward meningeal bulging through expanded foramina. No acute osseous finding. Review of the MIP images confirms the above findings. IMPRESSION: 1. Negative for pulmonary embolism or other acute finding. 2. Aortic Atherosclerosis (ICD10-I70.0) and Emphysema (ICD10-J43.9). Electronically Signed   By: Monte Fantasia M.D.   On: 11/06/2017 20:25      Impression / Plan:   Racheal Patches  is a 72 y.o. y/o male with history of COPD, presents with increased shortness of breath at home, and GI consulted for anemia, with no signs of active GI bleeding  Given that all of his cell lines have decreased since recent labs on June 27, is consistent with likely chemotherapy induced adverse effect  He does not have any signs of active GI bleeding He is not on any NSAIDs No indication for endoscopy at this time If any signs of active GI bleeding occur, please page GI.    Continue Protonix 40 IV twice daily. On discharge he should be continued on once daily Protonix for 30 days and then discontinued.  Avoid NSAIDs Continue serial CBCs and transfuse PRN  Recommend Hematology consult due to decrease in all cell lines since recent lab work  H&P notes fecal occult test was positive in the emergency room.  Fecal occult test by itself without any signs of active GI bleeding, is not significant for hemodynamically significant  GI bleed.    Elective colonoscopy, to rule out colonic malignancy due to the fecal occult blood test (which also can be false positive), can be done on an outpatient basis after acute medical issues due to chemotherapy-induced adverse effects, and decrease in cell lines is addressed.  This was discussed with the patient, and he states he gets his colonoscopy every 5 years by the same GI physician.  States his last one was 5 years ago and polyps were removed, and states he was planning on scheduling this with them as well.  He plans on calling him after his discharge from the hospital to schedule a colonoscopy.  He should follow-up with his primary gastroenterologist on discharge to schedule this.  In addition, MCV is also normal in this patient  Okay to resume diet since no endoscopy planned at this time.   Thank you for involving me in the care of this patient.      LOS: 1 day   Virgel Manifold, MD  11/07/2017, 8:56 AM

## 2017-11-08 LAB — BASIC METABOLIC PANEL
Anion gap: 7 (ref 5–15)
BUN: 17 mg/dL (ref 8–23)
CHLORIDE: 113 mmol/L — AB (ref 98–111)
CO2: 22 mmol/L (ref 22–32)
Calcium: 8.5 mg/dL — ABNORMAL LOW (ref 8.9–10.3)
Creatinine, Ser: 1.27 mg/dL — ABNORMAL HIGH (ref 0.61–1.24)
GFR calc Af Amer: >60 mL/min (ref >60)
GFR calc non Af Amer: 55 mL/min — ABNORMAL LOW (ref >60)
GLUCOSE: 89 mg/dL (ref 70–99)
POTASSIUM: 4.3 mmol/L (ref 3.5–5.1)
SODIUM: 142 mmol/L (ref 135–145)

## 2017-11-08 LAB — TYPE AND SCREEN
ABO/RH(D): O POS
Antibody Screen: NEGATIVE
UNIT DIVISION: 0

## 2017-11-08 LAB — VITAMIN B12: Vitamin B-12: 447 pg/mL (ref 180–914)

## 2017-11-08 LAB — IRON AND TIBC
IRON: 47 ug/dL (ref 45–182)
Saturation Ratios: 19 % (ref 17.9–39.5)
TIBC: 255 ug/dL (ref 250–450)
UIBC: 208 ug/dL

## 2017-11-08 LAB — BPAM RBC
BLOOD PRODUCT EXPIRATION DATE: 201908052359
ISSUE DATE / TIME: 201907080316
UNIT TYPE AND RH: 5100

## 2017-11-08 LAB — FERRITIN: Ferritin: 964 ng/mL — ABNORMAL HIGH (ref 24–336)

## 2017-11-08 LAB — FOLATE: Folate: 25 ng/mL (ref >5.9)

## 2017-11-08 MED ORDER — FAMOTIDINE 20 MG PO TABS: 20.0000 mg | ORAL_TABLET | Freq: Two times a day (BID) | ORAL | 1 refills | Status: AC

## 2017-11-08 MED ORDER — PREDNISONE 10 MG (21) PO TBPK: ORAL_TABLET | ORAL | 0 refills | Status: AC

## 2017-11-08 MED ORDER — AMOXICILLIN-POT CLAVULANATE 875-125 MG PO TABS: 1.0000 | ORAL_TABLET | Freq: Two times a day (BID) | ORAL | 0 refills | Status: AC

## 2017-11-08 MED ORDER — TAMSULOSIN HCL 0.4 MG PO CAPS: 0.4000 mg | ORAL_CAPSULE | Freq: Every day | ORAL | 0 refills | Status: AC

## 2017-11-08 MED ORDER — ALBUTEROL SULFATE HFA 108 (90 BASE) MCG/ACT IN AERS: 2.0000 | INHALATION_SPRAY | Freq: Four times a day (QID) | RESPIRATORY_TRACT | 2 refills | Status: AC | PRN

## 2017-11-08 NOTE — Discharge Summary (Signed)
KENT RIENDEAU, is a 72 y.o. male  DOB 10/23/1945  MRN [id number].  Admission date:  11/06/2017  Admitting Physician  Amelia Jo, MD  Discharge Date:  11/08/2017   Primary MD  System, Pcp Not In  Recommendations for primary care physician for things to follow:   Discharge home, patient has PCP at [facility], patient also has a primary oncologist to [facility] so I advised the patient to follow-up with Dr. Kalman Shan at [facility] for his chemotherapy as scheduled.   Admission Diagnosis  Shortness of breath [R06.02] Gastrointestinal hemorrhage, unspecified gastrointestinal hemorrhage type [K92.2] Anemia, unspecified type [D64.9]   Discharge Diagnosis  Shortness of breath [R06.02] Gastrointestinal hemorrhage, unspecified gastrointestinal hemorrhage type [K92.2] Anemia, unspecified type [D64.9]    Active Problems:   Acute respiratory distress   Malnutrition of moderate degree      Past Medical History:  Diagnosis Date  . Cancer [facility])    bladder cancer  . COPD (chronic obstructive pulmonary disease) ([place])     Past Surgical History:  Procedure Laterality Date  . CHOLECYSTECTOMY    . HERNIA REPAIR         History of present illness and  Hospital Course:     Kindly see H&P for history of present illness and admission details, please review complete Labs, Consult reports and Test reports for all details in brief  HPI  from the history and physical done on the day of admission 72 year old male patient with history of chronic respiratory failure o due to severe COPD on 3 and half liters of oxygen, urinary bladder cancer getting chemotherapy at [facility] came in because of worsening shortness of breath for 3 days associated with productive cough, no fever, chills .Marland Kitchen Admitted for COPD exacerbation.   Hospital Course   #1 acute respiratory distress  secondary to COPD exacerbation on underlying COPD and chronic respiratory failure.  Patient received ,bronchodilators discharging home with bronchodilators, tapering steroids, empiric antibiotic.  Patient told me that he has nebulizer at home is not working so I asked case manager to set up new nebulizer. 2.  Acute on chronic anemia, hemoglobin 7.7 on admission, patient is getting chemotherapy at [facility] for bladder cancer hemotherapy was June 27 as per [facility] oncology note.  Supposed to follow with them for next chemotherapy. Because of anemia patient received 1 unit of packed RBC transfusion, hemoglobin improved from 7-9.2. 3.  Pancytopenia with anemia, low platelets up to 53: Likely secondary to chemotherapy, patient will follow with primary oncologist. 4.  guiac positive stool, seen by gastroenterology, received Protonix drip but gastroenterology recommended no further work-up, patient can have colonoscopy as an outpatient.  And has pancytopenia and recommended oncology consult but patient already has a primary oncologist at [facility] that he can follow with.  #5. hypokalemia: Potassium replaced on potassium improved from 3.1-4.3. 6.  Weakness, patient currently living at home with his roommate, patient baseline is independent with activities of daily living and occasionally uses walker, we are going to arrange home health services to help him with weakness.  He still drives it to chemo at [facility].   Discharge Condition: Stable   Follow UP  Follow-up Information    oncologist at [facility] Follow up.        pcp at [facility] Follow up.             Discharge Instructions  and  Discharge Medications     Discharge Instructions    DME Nebulizer machine   Complete by:  As  directed    Patient needs a nebulizer to treat with the following condition:  COPD (chronic obstructive pulmonary disease) ([place])   Face-to-face encounter (required for Medicare/Medicaid patients)   Complete by:  As directed    I Epifanio Lesches certify that this patient is under my care and that I, or a nurse practitioner or physician's assistant working with me, had a face-to-face encounter that meets the physician face-to-face encounter requirements with this patient on 11/08/2017. The encounter with the patient was in whole, or in part for the following medical condition(s) which is the primary reason for home health care Deconditioning Acute on chronic respiratory failure COPD exacerbation Bladder cancer, followed by [facility]   The encounter with the patient was in whole, or in part, for the following medical condition, which is the primary reason for home health care:  whole   I certify that, based on my findings, the following services are medically necessary home health services:  Physical therapy   Reason for Medically Necessary Home Health Services:  Therapy- Personnel officer, Public librarian   My clinical findings support the need for the above services:  Unable to leave home safely without assistance and/or assistive device   Further, I certify that my clinical findings support that this patient is homebound due to:  Shortness of Breath with activity   Home Health   Complete by:  As directed    To provide the following care/treatments:   PT Home Health Aide       Allergies as of 11/08/2017      Reactions   Ibuprofen    Legs swell      Medication List    TAKE these medications   albuterol 108 (90 Base) MCG/ACT inhaler Commonly known as:  PROVENTIL HFA;VENTOLIN HFA Inhale 2 puffs into the lungs every 6 (six) hours as needed for wheezing or shortness of breath.   amLODipine 5 MG tablet Commonly known as:  NORVASC Take 5 mg by mouth daily.   amoxicillin-clavulanate 875-125 MG tablet Commonly known as:  AUGMENTIN Take 1 tablet by mouth 2 (two) times daily for 7 days.   atorvastatin 80 MG tablet Commonly known as:  LIPITOR Take 80 mg by mouth daily.   D 1000 1000 units capsule Generic drug:   Cholecalciferol Take 1 tablet by mouth daily.   dexamethasone 4 MG tablet Commonly known as:  DECADRON Take 4 mg by mouth daily. Take 2 tablets (8MG ) once a day on days 2,3,4,9,10,11 of treatment   famotidine 20 MG tablet Commonly known as:  PEPCID Take 1 tablet (20 mg total) by mouth 2 (two) times daily.   MULTI-VITAMINS Tabs Take 1 tablet by mouth daily.   predniSONE 10 MG (21) Tbpk tablet Commonly known as:  STERAPRED UNI-PAK 21 TAB Taper by 10 mg daily   prochlorperazine 10 MG tablet Commonly known as:  COMPAZINE Take 10 mg by mouth every 6 (six) hours as needed for nausea or vomiting.   senna-docusate 8.6-50 MG tablet Commonly known as:  Senokot-S Take 2 tablets by mouth daily as needed for mild constipation.   tamsulosin 0.4 MG Caps capsule Commonly known as:  FLOMAX Take 1 capsule (0.4 mg total) by mouth daily.            Durable Medical Equipment  (From admission, onward)        Start     Ordered   11/08/17 0000  DME Nebulizer machine    Question:  Patient needs  a nebulizer to treat with the following condition  Answer:  COPD (chronic obstructive pulmonary disease) ([place])   11/08/17 3557        Diet and Activity recommendation: See Discharge Instructions above   Consults obtained -none   Major procedures and Radiology Reports - PLEASE review detailed and final reports for all details, in brief -      Dg Chest 2 View  Result Date: 11/06/2017 CLINICAL DATA:  Shortness of breath for 3 days. EXAM: CHEST - 2 VIEW COMPARISON:  Chest x-ray dated 02/18/2010. FINDINGS: Cardiomediastinal silhouette appears grossly stable in size and configuration. Lungs appear clear. No pleural effusion or pneumothorax. Chronic severe scoliosis of the thoracic spine. No acute or suspicious osseous finding. IMPRESSION: No active cardiopulmonary disease. No evidence of pneumonia or pulmonary edema. Electronically Signed   By: Franki Cabot M.D.   On: 11/06/2017 17:17   Ct  Angio Chest Pe W And/or Wo Contrast  Result Date: 11/06/2017 CLINICAL DATA:  Right lower chest pain and shortness of breath. History of bladder cancer. EXAM: CT ANGIOGRAPHY CHEST WITH CONTRAST TECHNIQUE: Multidetector CT imaging of the chest was performed using the standard protocol during bolus administration of intravenous contrast. Multiplanar CT image reconstructions and MIPs were obtained to evaluate the vascular anatomy. CONTRAST:  38mL OMNIPAQUE IOHEXOL 350 MG/ML SOLN COMPARISON:  None. FINDINGS: Cardiovascular: Satisfactory opacification of the pulmonary arteries to the segmental level. No evidence of pulmonary embolism. Normal heart size. No pericardial effusion. Aortic and coronary atherosclerotic plaque Mediastinum/Nodes: Negative for adenopathy. Lungs/Pleura: Prominent emphysema with hyperinflation. There is no edema, consolidation, effusion, or pneumothorax. Patient had a staging scan 10/24/2017 at [facility] per epic, images not available. Upper Abdomen: Colonic diverticulosis Musculoskeletal: Marked thoracic levoscoliosis with leftward meningeal bulging through expanded foramina. No acute osseous finding. Review of the MIP images confirms the above findings. IMPRESSION: 1. Negative for pulmonary embolism or other acute finding. 2. Aortic Atherosclerosis (ICD10-I70.0) and Emphysema (ICD10-J43.9). Electronically Signed   By: Monte Fantasia M.D.   On: 11/06/2017 20:25    Micro Results    No results found for this or any previous visit (from the past 240 hour(s)).     Today   Subjective:   Pinkney Venard today has no headache,no chest abdominal pain,no new weakness tingling or numbness, feels much better wants to go home today.   Objective:   Blood pressure (!) 104/58, pulse 86, temperature 97.7 F (36.5 C), temperature source Oral, resp. rate 20, height 5\' 4"  (1.626 m), weight 56.9 kg (125 lb 8 oz), SpO2 99 %.   Intake/Output Summary (Last 24 hours) at 11/08/2017 1010 Last data filed at  11/08/2017 0847 Gross per 24 hour  Intake 1156 ml  Output 525 ml  Net 631 ml    Exam Awake Alert, Oriented x 3, No new F.N deficits, Normal affect [place].AT,PERRAL Supple Neck,No JVD, No cervical lymphadenopathy appriciated.  Symmetrical Chest wall movement, Good air movement bilaterally, CTAB RRR,No Gallops,Rubs or new Murmurs, No Parasternal Heave +ve B.Sounds, Abd Soft, Non tender, No organomegaly appriciated, No rebound -guarding or rigidity. No Cyanosis, Clubbing or edema, No new Rash or bruise  Data Review   CBC w Diff:  Lab Results  Component Value Date   WBC 7.3 11/07/2017   HGB 9.2 (L) 11/07/2017   HCT 26.4 (L) 11/07/2017   PLT 53 (L) 11/07/2017   LYMPHOPCT 29 11/06/2017   MONOPCT 8 11/06/2017   EOSPCT 1 11/06/2017   BASOPCT 0 11/06/2017    CMP:  Lab  Results  Component Value Date   NA 142 11/08/2017   K 4.3 11/08/2017   CL 113 (H) 11/08/2017   CO2 22 11/08/2017   BUN 17 11/08/2017   CREATININE 1.27 (H) 11/08/2017  .   Total Time in preparing paper work, data evaluation and todays exam - 66 minutes  Epifanio Lesches M.D on 11/08/2017 at 10:10 AM    Note: This dictation was prepared with Dragon dictation along with smaller phrase technology. Any transcriptional errors that result from this process are unintentional.

## 2017-11-08 NOTE — Progress Notes (Signed)
Pt out via w/c at this time for home with  Friend. A/o. No resp distress. 02 switched to home tank at 3 l. Instructions discussed with pt. presc given and that and home med and other presc discussed. Diet / activity  And f/u discussed. Verbalizes understanding. Sl d/cd earlier this pm .  No c/o.

## 2017-11-08 NOTE — Discharge Instructions (Signed)
Blood Transfusion, Adult A blood transfusion is a procedure in which you receive donated blood, including plasma, platelets, and red blood cells, through an IV tube. You may need a blood transfusion because of illness, surgery, or injury. The blood may come from a donor. You may also be able to donate blood for yourself (autologous blood donation) before a surgery if you know that you might require a blood transfusion. The blood given in a transfusion is made up of different types of cells. You may receive:  Red blood cells. These carry oxygen to the cells in the body.  White blood cells. These help you fight infections.  Platelets. These help your blood to clot.  Plasma. This is the liquid part of your blood and it helps with fluid imbalances.  If you have hemophilia or another clotting disorder, you may also receive other types of blood products. Tell a health care provider about:  Any allergies you have.  All medicines you are taking, including vitamins, herbs, eye drops, creams, and over-the-counter medicines.  Any problems you or family members have had with anesthetic medicines.  Any blood disorders you have.  Any surgeries you have had.  Any medical conditions you have, including any recent fever or cold symptoms.  Whether you are pregnant or may be pregnant.  Any previous reactions you have had during a blood transfusion. What are the risks? Generally, this is a safe procedure. However, problems may occur, including:  Having an allergic reaction to something in the donated blood. Hives and itching may be symptoms of this type of reaction.  Fever. This may be a reaction to the white blood cells in the transfused blood. Nausea or chest pain may accompany a fever.  Iron overload. This can happen from having many transfusions.  Transfusion-related acute lung injury (TRALI). This is a rare reaction that causes lung damage. The cause is not known.TRALI can occur within hours  of a transfusion or several days later.  Sudden (acute) or delayed hemolytic reactions. This happens if your blood does not match the cells in your transfusion. Your bodys defense system (immune system) may try to attack the new cells. This complication is rare. The symptoms include fever, chills, nausea, and low back pain or chest pain.  Infection or disease transmission. This is rare.  What happens before the procedure?  You will have a blood test to determine your blood type. This is necessary to know what kind of blood your body will accept and to match it to the donor blood.  If you are going to have a planned surgery, you may be able to do an autologous blood donation. This may be done in case you need to have a transfusion.  If you have had an allergic reaction to a transfusion in the past, you may be given medicine to help prevent a reaction. This medicine may be given to you by mouth or through an IV tube.  You will have your temperature, blood pressure, and pulse monitored before the transfusion.  Follow instructions from your health care provider about eating and drinking restrictions.  Ask your health care provider about: ? Changing or stopping your regular medicines. This is especially important if you are taking diabetes medicines or blood thinners. ? Taking medicines such as aspirin and ibuprofen. These medicines can thin your blood. Do not take these medicines before your procedure if your health care provider instructs you not to. What happens during the procedure?  An IV tube will  be inserted into one of your veins.  The bag of donated blood will be attached to your IV tube. The blood will then enter through your vein.  Your temperature, blood pressure, and pulse will be monitored regularly during the transfusion. This monitoring is done to detect early signs of a transfusion reaction.  If you have any signs or symptoms of a reaction, your transfusion will be stopped  and you may be given medicine.  When the transfusion is complete, your IV tube will be removed.  Pressure may be applied to the IV site for a few minutes.  A bandage (dressing) will be applied. The procedure may vary among health care providers and hospitals. What happens after the procedure?  Your temperature, blood pressure, heart rate, breathing rate, and blood oxygen level will be monitored often.  Your blood may be tested to see how you are responding to the transfusion.  You may be warmed with fluids or blankets to maintain a normal body temperature. Summary  A blood transfusion is a procedure in which you receive donated blood, including plasma, platelets, and red blood cells, through an IV tube.  Your temperature, blood pressure, and pulse will be monitored before, during, and after the transfusion.  Your blood may be tested after the transfusion to see how your body has responded. This information is not intended to replace advice given to you by your health care provider. Make sure you discuss any questions you have with your health care provider. Document Released: 04/16/2000 Document Revised: 01/15/2016 Document Reviewed: 01/15/2016 Elsevier Interactive Patient Education  2018 [employer].   Acute Respiratory Failure, Adult Acute respiratory failure occurs when there is not enough oxygen passing from your lungs to your body. When this happens, your lungs have trouble removing carbon dioxide from the blood. This causes your blood oxygen level to drop too low as carbon dioxide builds up. Acute respiratory failure is a medical emergency. It can develop quickly, but it is temporary if treated promptly. Your lung capacity, or how much air your lungs can hold, may improve with time, exercise, and treatment. What are the causes? There are many possible causes of acute respiratory failure, including:  Lung injury.  Chest injury or damage to the ribs or tissues near the  lungs.  Lung conditions that affect the flow of air and blood into and out of the lungs, such as pneumonia, acute respiratory distress syndrome, and cystic fibrosis.  Medical conditions, such as strokes or spinal cord injuries, that affect the muscles and nerves that control breathing.  Blood infection (sepsis).  Inflammation of the pancreas (pancreatitis).  A blood clot in the lungs (pulmonary embolism).  A large-volume blood transfusion.  Burns.  Near-drowning.  Seizure.  Smoke inhalation.  Reaction to medicines.  Alcohol or drug overdose.  What increases the risk? This condition is more likely to develop in people who have:  A blocked airway.  Asthma.  A condition or disease that damages or weakens the muscles, nerves, bones, or tissues that are involved in breathing.  A serious infection.  A health problem that blocks the unconscious reflex that is involved in breathing, such as hypothyroidism or sleep apnea.  A lung injury or trauma.  What are the signs or symptoms? Trouble breathing is the main symptom of acute respiratory failure. Symptoms may also include:  Rapid breathing.  Restlessness or anxiety.  Skin, lips, or fingernails that appear blue (cyanosis).  Rapid heart rate.  Abnormal heart rhythms (arrhythmias).  Confusion  or changes in behavior.  Tiredness or loss of energy.  Feeling sleepy or having a loss of consciousness.  How is this diagnosed? Your health care provider can diagnose acute respiratory failure with a medical history and physical exam. During the exam, your health care provider will listen to your heart and check for crackling or wheezing sounds in your lungs. Your may also have tests to confirm the diagnosis and determine what is causing respiratory failure. These tests may include:  Measuring the amount of oxygen in your blood (pulse oximetry). The measurement comes from a small device that is placed on your finger, earlobe,  or toe.  Other blood tests to measure blood gases and to look for signs of infection.  Sampling your cerebral spinal fluid or tracheal fluid to check for infections.  Chest X-ray to look for fluid in spaces that should be filled with air.  Electrocardiogram (ECG) to look at the heart's electrical activity.  How is this treated? Treatment for this condition usually takes places in a hospital intensive care unit (ICU). Treatment depends on what is causing the condition. It may include one or more treatments until your symptoms improve. Treatment may include:  Supplemental oxygen. Extra oxygen is given through a tube in the nose, a face mask, or a hood.  A device such as a continuous positive airway pressure (CPAP) or bi-level positive airway pressure (BiPAP or BPAP) machine. This treatment uses mild air pressure to keep the airways open. A mask or other device will be placed over your nose or mouth. A tube that is connected to a motor will deliver oxygen through the mask.  Ventilator. This treatment helps move air into and out of the lungs. This may be done with a bag and mask or a machine. For this treatment, a tube is placed in your windpipe (trachea) so air and oxygen can flow to the lungs.  Extracorporeal membrane oxygenation (ECMO). This treatment temporarily takes over the function of the heart and lungs, supplying oxygen and removing carbon dioxide. ECMO gives the lungs a chance to recover. It may be used if a ventilator is not effective.  Tracheostomy. This is a procedure that creates a hole in the neck to insert a breathing tube.  Receiving fluids and medicines.  Rocking the bed to help breathing.  Follow these instructions at home:  Take over-the-counter and prescription medicines only as told by your health care provider.  Return to normal activities as told by your health care provider. Ask your health care provider what activities are safe for you.  Keep all follow-up  visits as told by your health care provider. This is important. How is this prevented? Treating infections and medical conditions that may lead to acute respiratory failure can help prevent the condition from developing. Contact a health care provider if:  You have a fever.  Your symptoms do not improve or they get worse. Get help right away if:  You are having trouble breathing.  You lose consciousness.  Your have cyanosis or turn blue.  You develop a rapid heart rate.  You are confused. These symptoms may represent a serious problem that is an emergency. Do not wait to see if the symptoms will go away. Get medical help right away. Call your local emergency services (911 in the [place] Do not drive yourself to the hospital. This information is not intended to replace advice given to you by your health care provider. Make sure you discuss any questions you have  with your health care provider. Document Released: 04/24/2013 Document Revised: 11/15/2015 Document Reviewed: 11/05/2015 Elsevier Interactive Patient Education  [employer].

## 2017-11-08 NOTE — Care Management Note (Signed)
Case Management Note  Patient Details  Name: Lee Clarke MRN: [id number] Date of Birth: [birth year]  Subjective/Objective: Ordered patient portable home O2 tanks from [facility] for delivery to [facility] prior to discharge. Updated primary nurse and patient. Offered patient home health services and agencies. Patient declined a Therapist, sports or HHA. Only wanted PT. Referral  to [facility] for HHPT.  Ordered patient a nebulizer from [facility].                  Action/Plan:   Expected Discharge Date:  11/08/17               Expected Discharge Plan:  [place]  In-House Referral:     Discharge planning Services  CM Consult  Post Acute Care Choice:  Home Health, Durable Medical Equipment Choice offered to:  Patient  DME Arranged:  Nebulizer machine DME Agency:  [place]:  PT [place]:  [facility]  Status of Service:  Completed, signed off  If discussed at [employer] of Stay Meetings, dates discussed:    Additional Comments:  Jolly Mango, RN 11/08/2017, 2:46 PM

## 2017-12-01 DEATH — deceased

## 2020-05-08 IMAGING — CT CT ANGIO CHEST
2 of 6 series · 19 of 36 positions shown · IV contrast (APPLIED)
Comparison: None.

CLINICAL DATA: Right lower chest pain and shortness of breath..
History of bladder cancer.

EXAM:
CT ANGIOGRAPHY CHEST WITH CONTRAST
TECHNIQUE: Multidetector CT imaging of the chest was performed using the
standard protocol during bolus administration of intravenous
contrast. Multiplanar CT image reconstructions and MIPs were
obtained to evaluate the vascular anatomy.
CONTRAST:  75mL OMNIPAQUE IOHEXOL 350 MG/ML SOLN

[Series 5: thins · axial · 0.57mm/px · z∈[-293,-77]mm · 18 of 242 slices shown]
[im 13/242  lung]
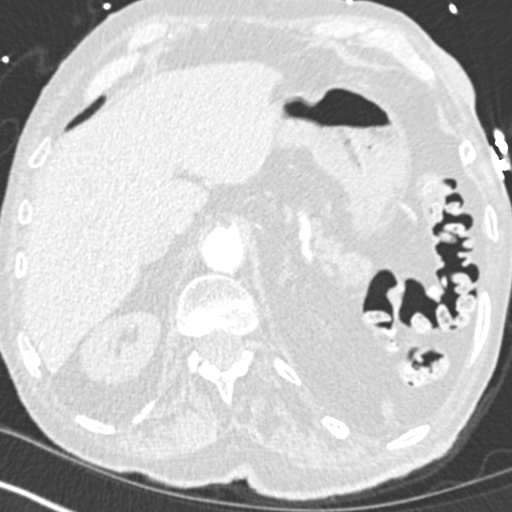
[im 25/242  mediastinal]
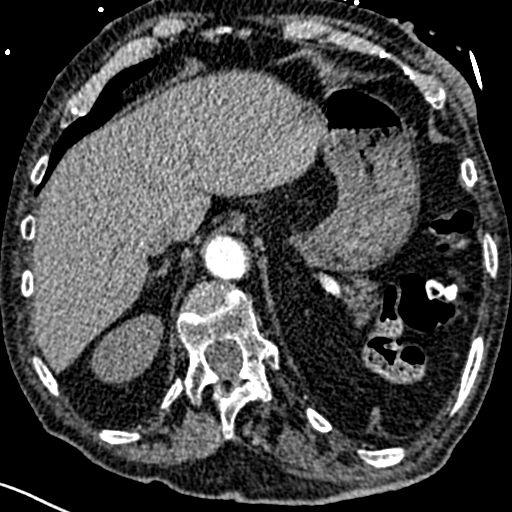
[im 37/242  lung]
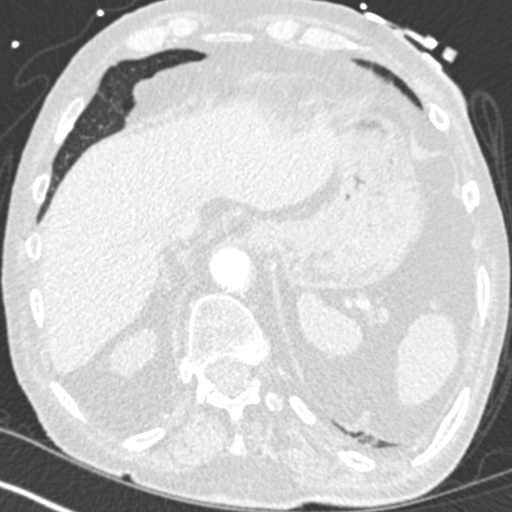
[im 49/242  mediastinal]
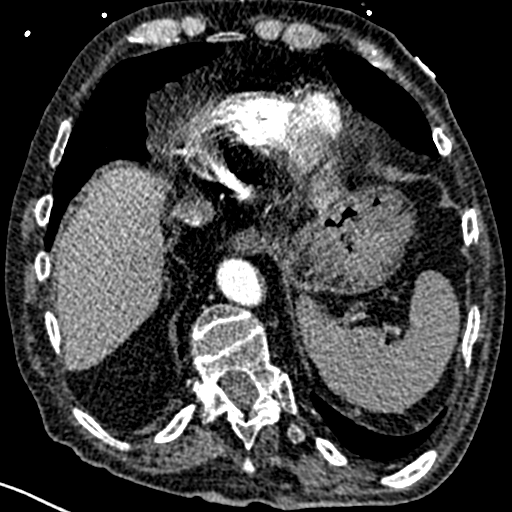
[im 61/242  lung]
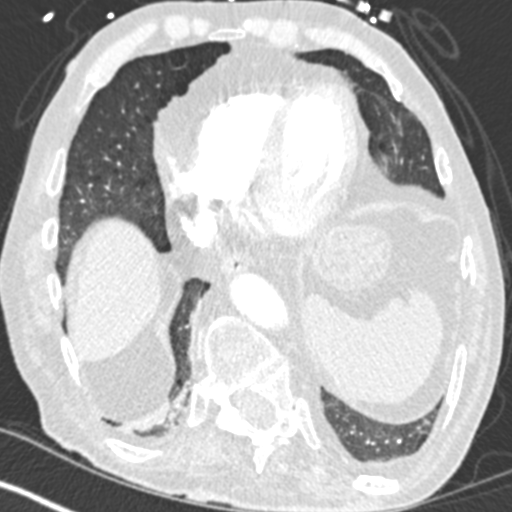
[im 73/242  mediastinal]
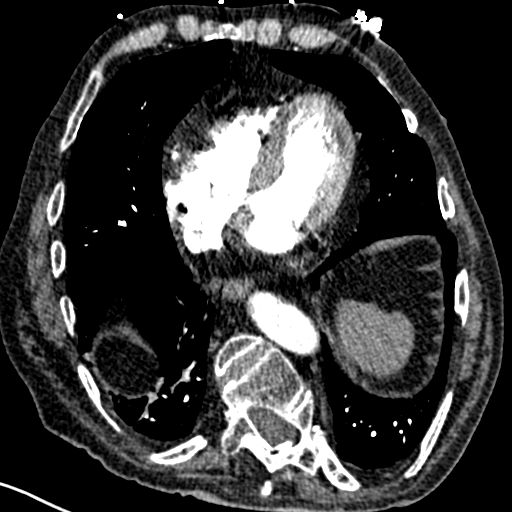
[im 85/242  lung]
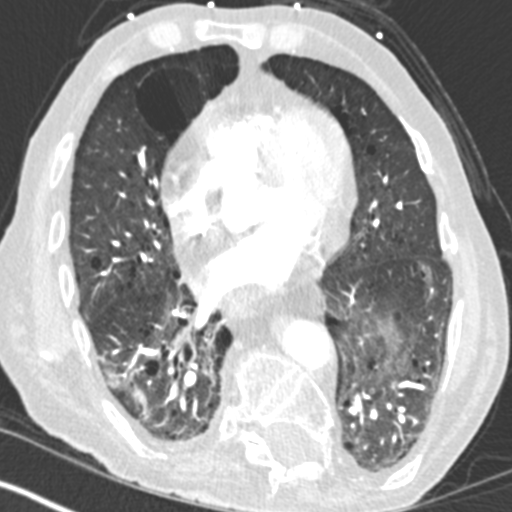
[im 97/242  mediastinal]
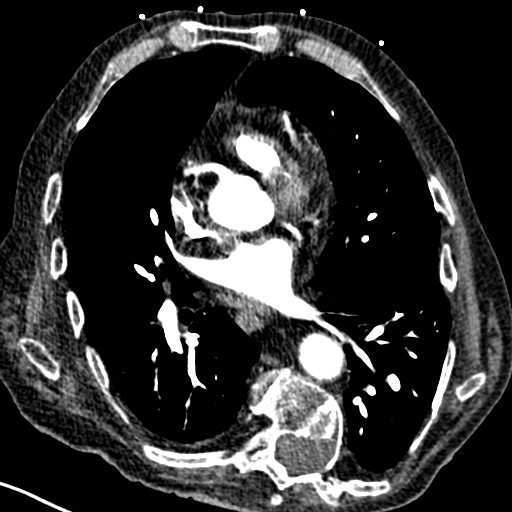
[im 109/242  lung]
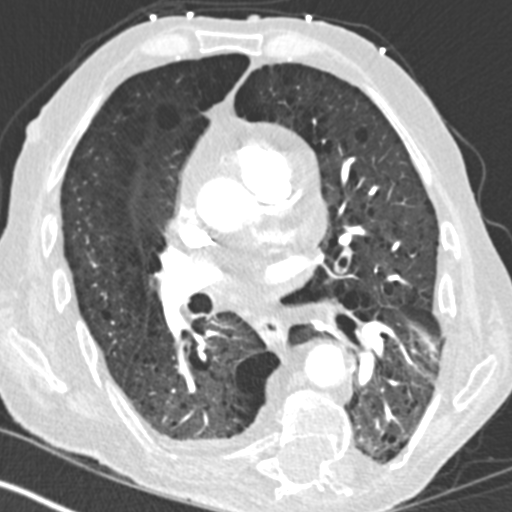
[im 133/242  mediastinal]
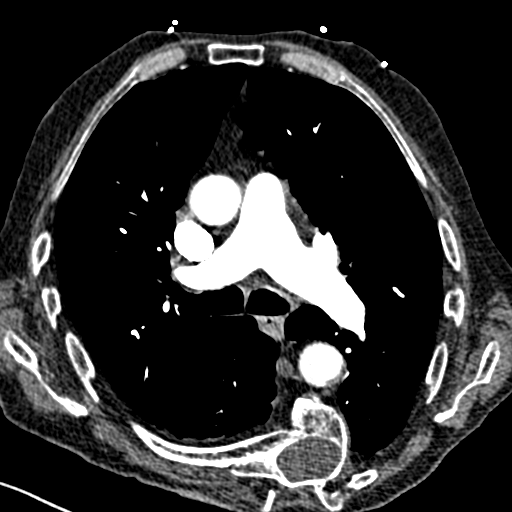
[im 145/242  lung]
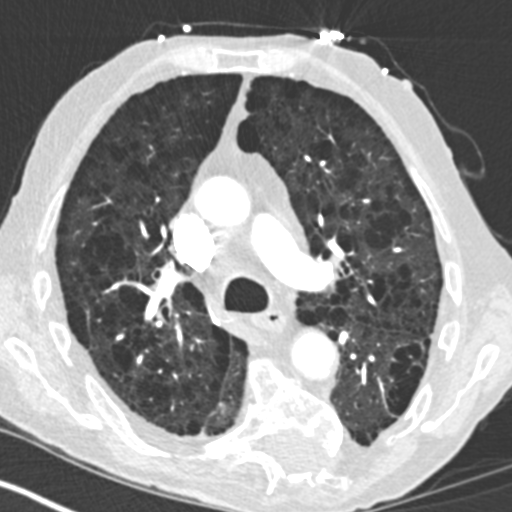
[im 157/242  mediastinal]
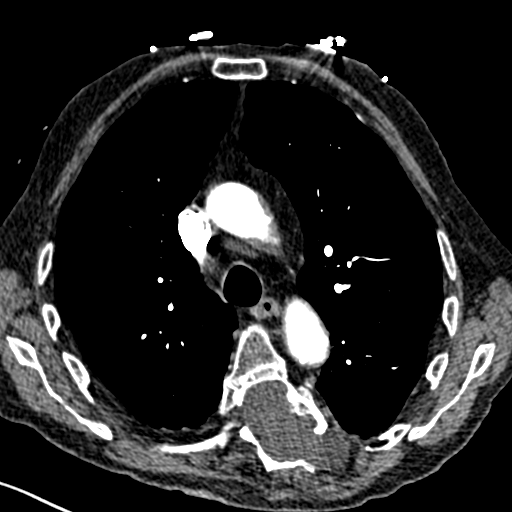
[im 169/242  lung]
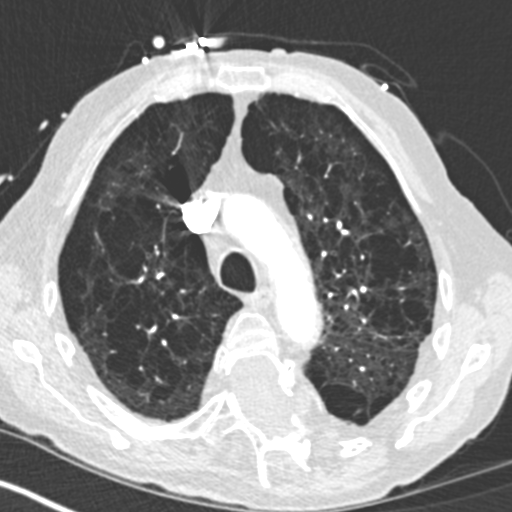
[im 181/242  mediastinal]
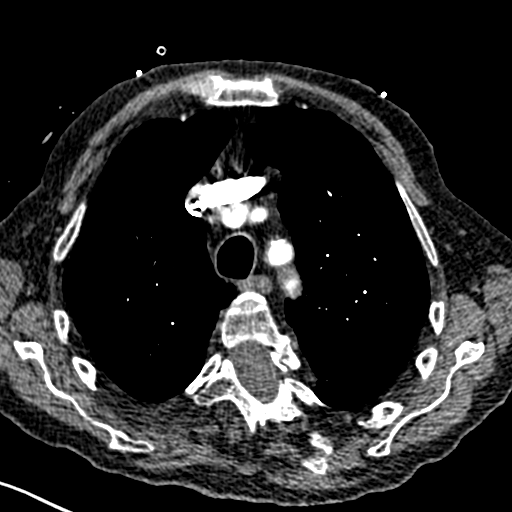
[im 193/242  lung]
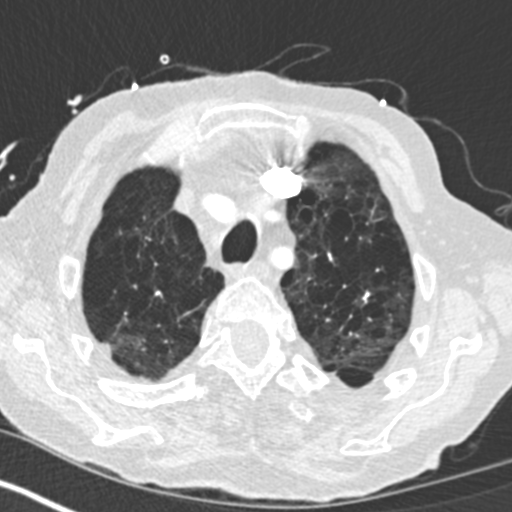
[im 205/242  mediastinal]
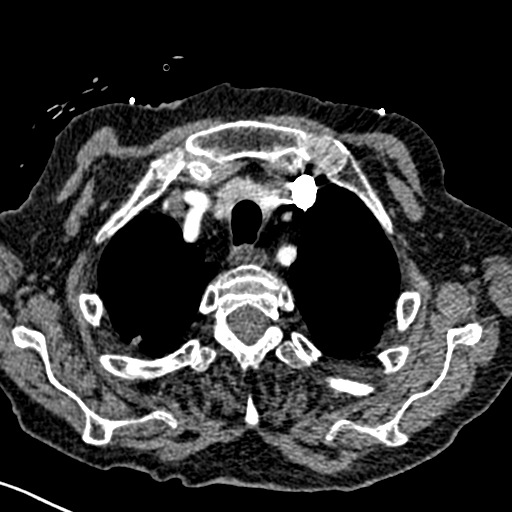
[im 217/242  lung]
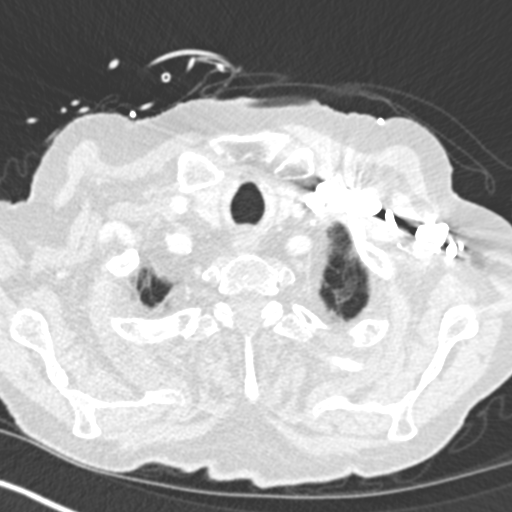
[im 229/242  mediastinal]
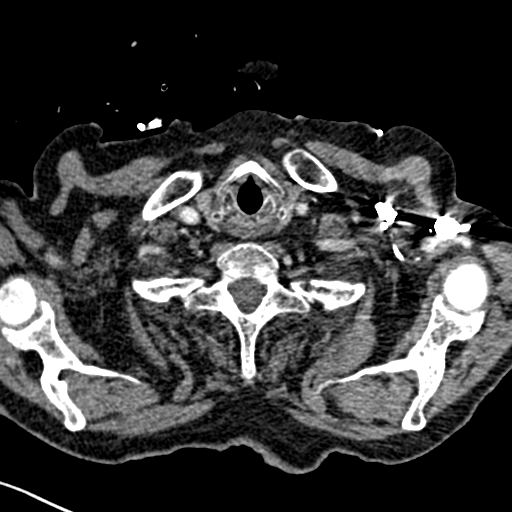

[Series 7: coronal mpr · coronal · 0.49mm/px · 1 of 98 slices shown]
[im 49/98  mediastinal]
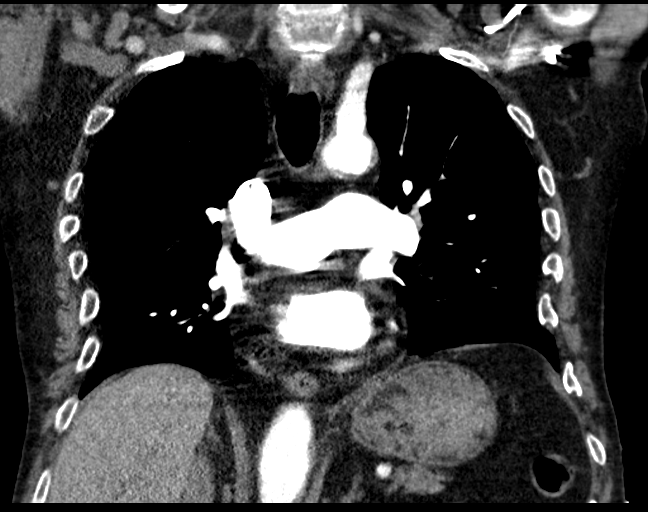

[19 of 36 positions shown; findings below may reference images not displayed]

FINDINGS: Cardiovascular: Satisfactory opacification of the pulmonary arteries
to the segmental level. No evidence of pulmonary embolism. Normal
heart size. No pericardial effusion. Aortic and coronary
atherosclerotic plaque

Mediastinum/Nodes: Negative for adenopathy.

Lungs/Pleura: Prominent emphysema with hyperinflation. There is no
edema, consolidation, effusion, or pneumothorax.. Patient had a
staging scan 10/24/2017 at [[email or web address]] per [REDACTED], images not available.

Upper Abdomen: Colonic diverticulosis

Musculoskeletal: Marked thoracic levoscoliosis with leftward
meningeal bulging through expanded foramina. No acute osseous
finding.

Review of the MIP images confirms the above findings.
IMPRESSION: 1. Negative for pulmonary embolism or other acute finding.
2. Aortic Atherosclerosis (CSFMP-4K8.8) and Emphysema ([id number].3).
